# Patient Record
Sex: Female | Born: 2005 | Race: White | Hispanic: No | Marital: Single | Smoking: Never smoker
Health system: Southern US, Community
[De-identification: ages and names within clinical notes are randomized; demographics above are authoritative.]

## PROBLEM LIST (undated history)

## (undated) DIAGNOSIS — R569 Unspecified convulsions: Secondary | ICD-10-CM

## (undated) HISTORY — DX: Unspecified convulsions: R56.9

---
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
## 2024-03-10 DIAGNOSIS — Z419 Encounter for procedure for purposes other than remedying health state, unspecified: Secondary | ICD-10-CM | POA: Diagnosis not present

## 2024-03-20 ENCOUNTER — Ambulatory Visit

## 2024-03-20 VITALS — BP 100/68 | HR 83 | Temp 98.7°F | Ht 68.0 in | Wt 146.0 lb

## 2024-03-20 DIAGNOSIS — Z1329 Encounter for screening for other suspected endocrine disorder: Secondary | ICD-10-CM

## 2024-03-20 DIAGNOSIS — Z1159 Encounter for screening for other viral diseases: Secondary | ICD-10-CM | POA: Diagnosis not present

## 2024-03-20 DIAGNOSIS — Z0000 Encounter for general adult medical examination without abnormal findings: Secondary | ICD-10-CM | POA: Insufficient documentation

## 2024-03-20 DIAGNOSIS — Z13228 Encounter for screening for other metabolic disorders: Secondary | ICD-10-CM

## 2024-03-20 DIAGNOSIS — Z1321 Encounter for screening for nutritional disorder: Secondary | ICD-10-CM | POA: Diagnosis not present

## 2024-03-20 DIAGNOSIS — Z130 Encounter for screening for diseases of the blood and blood-forming organs and certain disorders involving the immune mechanism: Secondary | ICD-10-CM | POA: Diagnosis not present

## 2024-03-20 DIAGNOSIS — Z23 Encounter for immunization: Secondary | ICD-10-CM

## 2024-03-20 DIAGNOSIS — L700 Acne vulgaris: Secondary | ICD-10-CM | POA: Insufficient documentation

## 2024-03-20 MED ORDER — TRI-LO-MILI 0.18/0.215/0.25 MG-25 MCG PO TABS: 1.0000 | ORAL_TABLET | Freq: Every day | ORAL | 2 refills | Status: AC

## 2024-03-20 NOTE — Assessment & Plan Note (Signed)
 18 year old female establishing care. Family history of colon cancer. Vaccines up to date except HPV. - Administered final dose of HPV vaccine. - Ordered fasting blood work: blood counts, kidney and liver function, thyroid, cholesterol, A1c. - Screened for HIV and hepatitis C. - Scheduled follow-up in one year.

## 2024-03-20 NOTE — Assessment & Plan Note (Signed)
 Under dermatology care, using benzoyl peroxide cream. - Added benzoyl peroxide cream to medication list. - Continue oral contraceptive

## 2024-03-20 NOTE — Patient Instructions (Signed)
 VISIT SUMMARY: You came in today for an initial visit to establish care and to get refills on your medications. We discussed your history of seizures, contraceptive management, and acne treatment. We also completed a woman's wellness visit, including administering your final HPV vaccine dose and ordering some blood work.  YOUR PLAN: WOMAN'S WELLNESS VISIT: You are a 18 year old female establishing care. Your family has a history of colon cancer, and your vaccines are up to date except for HPV. -Administered the final dose of the HPV vaccine. -Ordered fasting blood work to check organ function, thyroid, cholesterol, and A1c levels. -Screened for HIV and hepatitis C. -Scheduled a follow-up appointment in one year.  CONTRACEPTIVE MANAGEMENT: You are using TriSprintec for birth control and experiencing regular menstrual cycles. -Refilled your TriSprintec prescription.  ACNE VULGARIS: You are under the care of a dermatologist and using benzoyl peroxide cream for acne treatment. -Added benzoyl peroxide cream to your medication list. -Continue your benzoyl peroxide cream prescription.  If you have any problems before your next visit feel free to message me via MyChart (minor issues or questions) or call the office, otherwise you may reach out to schedule an office visit.  Thank you! Saddie Sacks, PA-C

## 2024-03-20 NOTE — Progress Notes (Signed)
 "  New Patient Office Visit  Subjective    Patient ID: Vanessa Macias, female    DOB: 10/10/2005  Age: 18 y.o. MRN: [id number]  CC:  Chief Complaint  Patient presents with   New Patient (Initial Visit)    History of Present Illness   Vanessa Macias is a very pleasant 18 year old female who presents for an establishing care appointment and medication refills. 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 She is overall feeling well and is without concerns today.   Seizure history - Two seizures at ages 34 and 48 - No seizures since age 6 - Not currently under neurologist care  Contraceptive management - Currently taking TriSprintec for birth control - Experiencing regular menstrual cycles on current regimen  Acne management - Uses benzoyl peroxide for acne treatment - Under care of a dermatologist  Screenings:  Colon Cancer: NA Lung Cancer: NA Breast Cancer: NA Cervical Cancer: NA    Outpatient Encounter Medications as of 03/20/2024  Medication Sig   adapalene (DIFFERIN) 0.1 % gel Apply topically at bedtime.   TRI-SPRINTEC 0.18/0.215/0.25 MG-35 MCG tablet Take 1 tablet by mouth daily.   [DISCONTINUED] TRI-LO-MILI  0.18/0.215/0.25 MG-25 MCG TABS    TRI-LO-MILI  0.18/0.215/0.25 MG-25 MCG TABS Take 1 each by mouth daily. (Patient not taking: Reported on 03/20/2024)   No facility-administered encounter medications on file as of 03/20/2024.    Past Medical History:  Diagnosis Date   Seizure (HCC)     No past surgical history on file.  No family history on file.  Social History   Socioeconomic History   Marital status: Single    Spouse name: Not on file   Number of children: Not on file   Years of education: Not on file   Highest education level: Not on file  Occupational History   Not on file  Tobacco Use   Smoking status: Never   Smokeless tobacco: Never  Substance and Sexual Activity   Alcohol use: Not on file    Drug use: Not on file   Sexual activity: Not on file  Other Topics Concern   Not on file  Social History Narrative   Not on file   Social Drivers of Health   Tobacco Use: Low Risk (03/20/2024)   Patient History    Smoking Tobacco Use: Never    Smokeless Tobacco Use: Never    Passive Exposure: Not on file  Financial Resource Strain: Not on file  Food Insecurity: Not on file  Transportation Needs: Not on file  Physical Activity: Not on file  Stress: Not on file  Social Connections: Not on file  Intimate Partner Violence: Not on file  Depression (PHQ2-9): Low Risk (03/20/2024)   Depression (PHQ2-9)    PHQ-2 Score: 4  Alcohol Screen: Not on file  Housing: Not on file  Utilities: Not on file  Health Literacy: Not on file    ROS  Per HPI      Objective    BP 100/68   Pulse 83   Temp 98.7 F (37.1 C) (Oral)   Ht 5' 8 (1.727 m)   Wt 146 lb (66.2 kg)   LMP 03/06/2024   SpO2 100%   BMI 22.20 kg/m   Physical Exam Constitutional:      General: She is not in acute distress.    Appearance: Normal appearance.  Eyes:     Pupils: Pupils are equal, round, and reactive to light.  Cardiovascular:     Rate and Rhythm: Normal rate and regular rhythm.     Heart sounds: Normal heart sounds. No murmur heard.    No friction rub. No gallop.  Pulmonary:     Effort: Pulmonary effort is normal. No respiratory distress.     Breath sounds: Normal breath sounds.  Abdominal:     General: Bowel sounds are normal.  Musculoskeletal:        General: No swelling.     Cervical back: Neck supple.  Lymphadenopathy:     Cervical: No cervical adenopathy.  Skin:    General: Skin is warm and dry.  Neurological:     General: No focal deficit present.     Mental Status: She is alert.  Psychiatric:        Mood and Affect: Mood normal.        Behavior: Behavior normal.        Thought Content: Thought content normal.          Assessment & Plan:   Screening for endocrine,  nutritional, metabolic and immunity disorder -     VITAMIN D  25 Hydroxy (Vit-D Deficiency, Fractures); Future -     CBC with Differential/Platelet; Future -     Hemoglobin A1c; Future -     Comprehensive metabolic panel with GFR; Future -     Lipid panel; Future -     TSH; Future  Screening for viral disease -     HIV Antibody (routine testing w rflx); Future -     Hepatitis C antibody; Future  Encounter for vaccination -     HPV 9-valent vaccine,Recombinat  Acne vulgaris Assessment & Plan: Under dermatology care, using benzoyl peroxide cream. - Added benzoyl peroxide cream to medication list. - Continue oral contraceptive    Wellness examination Assessment & Plan: 18 year old female establishing care. Family history of colon cancer. Vaccines up to date except HPV. - Administered final dose of HPV vaccine. - Ordered fasting blood work: blood counts, kidney and liver function, thyroid, cholesterol, A1c. - Screened for HIV and hepatitis C. - Scheduled follow-up in one year.   Other orders -     Tri-Lo-Mili ; Take 1 each by mouth daily. (Patient not taking: Reported on 03/20/2024)  Dispense: 84 tablet; Refill: 2     Return in about 1 year (around 03/20/2025) for Physical.   Saddie JULIANNA Sacks, PA-C  "

## 2024-03-21 ENCOUNTER — Ambulatory Visit: Payer: Self-pay

## 2024-03-21 LAB — LIPID PANEL
Chol/HDL Ratio: 3.3 ratio (ref 0.0–4.4)
Cholesterol, Total: 199 mg/dL — ABNORMAL HIGH (ref 100–169)
HDL: 61 mg/dL
LDL Chol Calc (NIH): 111 mg/dL — ABNORMAL HIGH (ref 0–109)
Triglycerides: 157 mg/dL — ABNORMAL HIGH (ref 0–89)
VLDL Cholesterol Cal: 27 mg/dL (ref 5–40)

## 2024-03-21 LAB — CBC WITH DIFFERENTIAL/PLATELET
Basophils Absolute: 0 x10E3/uL (ref 0.0–0.2)
Basos: 1 %
EOS (ABSOLUTE): 0.1 x10E3/uL (ref 0.0–0.4)
Eos: 2 %
Hematocrit: 37.2 % (ref 34.0–46.6)
Hemoglobin: 12.4 g/dL (ref 11.1–15.9)
Immature Grans (Abs): 0 x10E3/uL (ref 0.0–0.1)
Immature Granulocytes: 0 %
Lymphocytes Absolute: 1 x10E3/uL (ref 0.7–3.1)
Lymphs: 17 %
MCH: 30.9 pg (ref 26.6–33.0)
MCHC: 33.3 g/dL (ref 31.5–35.7)
MCV: 93 fL (ref 79–97)
Monocytes Absolute: 0.3 x10E3/uL (ref 0.1–0.9)
Monocytes: 6 %
Neutrophils Absolute: 4.3 x10E3/uL (ref 1.4–7.0)
Neutrophils: 74 %
Platelets: 257 x10E3/uL (ref 150–450)
RBC: 4.01 x10E6/uL (ref 3.77–5.28)
RDW: 12.5 % (ref 11.7–15.4)
WBC: 5.7 x10E3/uL (ref 3.4–10.8)

## 2024-03-21 LAB — COMPREHENSIVE METABOLIC PANEL WITH GFR
ALT: 13 IU/L (ref 0–32)
AST: 19 IU/L (ref 0–40)
Albumin: 4.3 g/dL (ref 4.0–5.0)
Alkaline Phosphatase: 66 IU/L (ref 42–106)
BUN/Creatinine Ratio: 13 (ref 9–23)
BUN: 9 mg/dL (ref 6–20)
Bilirubin Total: 0.5 mg/dL (ref 0.0–1.2)
CO2: 22 mmol/L (ref 20–29)
Calcium: 9.4 mg/dL (ref 8.7–10.2)
Chloride: 101 mmol/L (ref 96–106)
Creatinine, Ser: 0.7 mg/dL (ref 0.57–1.00)
Globulin, Total: 2.3 g/dL (ref 1.5–4.5)
Glucose: 78 mg/dL (ref 70–99)
Potassium: 4.2 mmol/L (ref 3.5–5.2)
Sodium: 137 mmol/L (ref 134–144)
Total Protein: 6.6 g/dL (ref 6.0–8.5)
eGFR: 128 mL/min/1.73

## 2024-03-21 LAB — HEPATITIS C ANTIBODY: Hep C Virus Ab: NONREACTIVE

## 2024-03-21 LAB — HIV ANTIBODY (ROUTINE TESTING W REFLEX): HIV Screen 4th Generation wRfx: NONREACTIVE

## 2024-03-21 LAB — HEMOGLOBIN A1C
Est. average glucose Bld gHb Est-mCnc: 105 mg/dL
Hgb A1c MFr Bld: 5.3 % (ref 4.8–5.6)

## 2024-03-21 LAB — TSH: TSH: 1.86 u[IU]/mL (ref 0.450–4.500)

## 2024-03-21 LAB — VITAMIN D 25 HYDROXY (VIT D DEFICIENCY, FRACTURES): Vit D, 25-Hydroxy: 24.2 ng/mL — ABNORMAL LOW (ref 30.0–100.0)

## 2024-04-20 ENCOUNTER — Other Ambulatory Visit: Payer: Self-pay

## 2024-04-20 ENCOUNTER — Telehealth: Payer: Self-pay

## 2024-04-20 DIAGNOSIS — L700 Acne vulgaris: Secondary | ICD-10-CM

## 2024-04-20 NOTE — Telephone Encounter (Signed)
 Referral has been placed.

## 2024-04-20 NOTE — Telephone Encounter (Signed)
 Copied from CRM #[id number]. Topic: Referral - Request for Referral >> Apr 20, 2024 12:03 PM Emylou G wrote: Did the patient discuss referral with their provider in the last year? No (If No - schedule appointment) (If Yes - send message)  Appointment offered? Yes  Type of order/referral and detailed reason for visit: dermatologist .. Her appt is tomorrow.. needs this asap.. just told today  Preference of office, provider, location: [facility] [phone] [email or web address]  If referral order, have you been seen by this specialty before? Yes - currently patient (If Yes, this issue or another issue? When? Where?  Can we respond through MyChart? Yes

## 2025-03-21 ENCOUNTER — Encounter
